# Patient Record
Sex: Female | Born: 1960 | Hispanic: Yes | Marital: Single | Smoking: Never smoker
Health system: Southern US, Community
[De-identification: ages and names within clinical notes are randomized; demographics above are authoritative.]

## PROBLEM LIST (undated history)

## (undated) HISTORY — PX: BREAST CYST EXCISION: SHX579

---

## 2008-11-10 HISTORY — PX: BREAST EXCISIONAL BIOPSY: SUR124

## 2012-04-20 ENCOUNTER — Telehealth: Payer: Self-pay | Admitting: Hematology & Oncology

## 2012-04-20 NOTE — Telephone Encounter (Signed)
Called pt to schedule appointment, no answer or voice mail °

## 2012-04-22 ENCOUNTER — Telehealth: Payer: Self-pay | Admitting: Hematology & Oncology

## 2012-04-22 NOTE — Telephone Encounter (Signed)
Pt aware of 05-10-12 appointment. She wants Spanish interpreter. Left [place] message to schedule interpreter.

## 2012-05-10 ENCOUNTER — Telehealth: Payer: Self-pay | Admitting: Hematology & Oncology

## 2012-05-10 ENCOUNTER — Ambulatory Visit (HOSPITAL_BASED_OUTPATIENT_CLINIC_OR_DEPARTMENT_OTHER): Payer: Self-pay | Admitting: Hematology & Oncology

## 2012-05-10 ENCOUNTER — Ambulatory Visit: Payer: Self-pay

## 2012-05-10 ENCOUNTER — Other Ambulatory Visit: Payer: Self-pay | Admitting: Lab

## 2012-05-10 VITALS — BP 109/72 | HR 68 | Temp 97.2°F | Ht 64.0 in | Wt 120.0 lb

## 2012-05-10 DIAGNOSIS — D72820 Lymphocytosis (symptomatic): Secondary | ICD-10-CM

## 2012-05-10 DIAGNOSIS — D72819 Decreased white blood cell count, unspecified: Secondary | ICD-10-CM

## 2012-05-10 LAB — CHCC SATELLITE - SMEAR

## 2012-05-10 LAB — CBC WITH DIFFERENTIAL (CANCER CENTER ONLY)
BASO%: 0.4 % (ref 0.0–2.0)
LYMPH#: 2.7 10*3/uL (ref 0.9–3.3)
LYMPH%: 59.4 % — ABNORMAL HIGH (ref 14.0–48.0)
MONO#: 0.3 10*3/uL (ref 0.1–0.9)
Platelets: 230 10*3/uL (ref 145–400)
RDW: 13.8 % (ref 11.1–15.7)
WBC: 4.5 10*3/uL (ref 3.9–10.0)

## 2012-05-10 NOTE — Progress Notes (Signed)
CC:   Gardiner Rhyme, MD  DIAGNOSIS:  Asymptomatic lymphocytosis.  HISTORY OF PRESENT ILLNESS:  Ms. Reddy is a very nice 51 year old [place] female.  She comes with an interpreter.  She, however, seemed to understand pretty well what was going on.  She is followed by Dr. Greggory Stallion.  Dr. Greggory Stallion noticed that she had some lymphocytosis on some blood work back in May.  Unfortunately I do not have any previous blood work before that.  She has been tested for HIV, hepatitis and all this was negative.  She had normal electrolytes and liver function tests.  TSH was also normal.  With her CBC, her white cell count was 4.7.  She had hemoglobin of 12.9, hematocrit 39.8.  Platelet count was 328.  She had a white cell differential of 34 segs, 59 lymphs, 5 monos.  Dr. Greggory Stallion was worried about this and as such, referred her to the [facility] for an evaluation.  Ms. Corrow feels well.  She has not had any problem with infections. There has been no weight loss or weight gain.  She has had no fevers or sweats.  There has been no fatigue.  There is no cough or shortness of breath.  There has been no change in bowel or bladder habits.  She has had no change in medications.  Again, we were asked to see her because of the lymphocytosis.  PAST MEDICAL HISTORY:  Remarkable for a left breast lumpectomy, benign condition.  ALLERGIES: 1. Cipro. 2. Naprosyn.  MEDICATIONS: 1. Neurontin 100 mg p.o. t.i.d. 2. Flonase nasal spray 1 spray each nostril 2 times a day.  SOCIAL HISTORY:  Negative for tobacco or alcohol use.  She has no obvious occupational exposures.  FAMILY HISTORY:  Remarkable for multiple members with cancer.  Her father died of lung cancer.  There is history of breast cancer, leukemia, I think throat cancer.  REVIEW OF SYSTEMS:  As stated in the history present illness.  No additional findings are noted on a 12 system review.  PHYSICAL EXAMINATION:  General:  This  is a well-developed, well- nourished Hispanic female in no obvious distress.  Vital signs:  Show a temperature of 97.9, pulse 68, respiratory rate 18, blood pressure 109/72.  Weight is 120.  Head and neck:  Normocephalic, atraumatic skull.  There are no ocular or oral lesions.  Pupils react appropriately.  There is no adenopathy in her neck or supraclavicular regions bilaterally.  Thyroid is nonpalpable.  Lungs:  Clear bilaterally.  Cardiac:  Regular rate and rhythm with a normal S1 and S2. There are no murmurs, rubs or bruits.  Axillary exam shows no bilateral axillary adenopathy.  Abdomen:  Soft with good bowel sounds.  There is no palpable abdominal mass.  There is no fluid wave.  There is no palpable hepatosplenomegaly.  Back:  No tenderness over the spine, ribs or hips.  Extremities:  Show no clubbing, cyanosis or edema.  Skin:  No rashes, ecchymoses or petechiae.  Neurological:  Shows no focal neurological deficits.  LABORATORY STUDIES:  White cell count is 4.5, hemoglobin 13.5, hematocrit 39.8, platelet count 230.  White cell differential shows 34 segs, 59 lymphs, 6 monos.  Peripheral smear shows a normochromic, normocytic population of red blood cells.  There are no nucleated red blood cells.  There are no teardrop cells.  I see no rouleaux formation. There are no schistocytes.  I see no target cells.  White cells appear with an increase in lymphocytes.  Lymphocytes appear mature for the most part.  There may be a couple large lymphocytes.  I do not see any immature myeloid or lymphoid forms.  There are no hypersegmented polys. I do not see any blasts.  Platelets are adequate in number and size.  IMPRESSION:  Ms. Oviatt is a 51 year old Hispanic female with a relative lymphocytosis.  Her white cell count is normal.  In fact, her total lymphocytes are within normal limits.  It is hard to say why she has a lymphocytosis.  From the blood smear, I do not see anything that looks  suspicious.  I do not believe we have to send off flow cytometry for a lymphoproliferative process.  I feel that the chance of her having a lymphoproliferative disorder is quite low.  I do not see anything on physical exam that is suspicious.  She has no lymphadenopathy.  There is no splenomegaly.  I do not see anything with respect to her skin that looks suspicious.  I believe that we can just watch Ms. Fluharty for now.  She does not need a bone marrow test.  I want see her back in 4 months.  Will see what her white cell differential is at that point in time.  If we find that there is a significant increase in lymphocytes, then we may order a flow cytometry study to rule out occult CLL.  Ms. Gendron is understandably concerned because of the family history of cancer.  I tried to reassure her as much as I could that I did not see any evidence of malignancy or any type of leukemia at this point time.  I spent a good hour with Ms. Zion today.  Her interpreter was very, very helpful and I really believe that Ms. Seda understood all that we were talking about.    ______________________________ Josph Macho, M.D. PRE/MEDQ  D:  05/10/2012  T:  05/10/2012  Job:  [id number]

## 2012-05-10 NOTE — Telephone Encounter (Signed)
Amber Orozco at [facility] is aware to schedule interpreter for 10-31 2 hours

## 2012-05-10 NOTE — Progress Notes (Signed)
This office note has been dictated.

## 2012-05-21 ENCOUNTER — Other Ambulatory Visit: Payer: Self-pay

## 2012-09-09 ENCOUNTER — Ambulatory Visit: Payer: Self-pay | Admitting: Hematology & Oncology

## 2012-09-09 ENCOUNTER — Other Ambulatory Visit: Payer: Self-pay | Admitting: Lab

## 2012-09-10 ENCOUNTER — Telehealth: Payer: Self-pay | Admitting: Hematology & Oncology

## 2012-09-10 NOTE — Telephone Encounter (Signed)
Left message through interpreter service for pt to call and reschedule missed 09-09-12 appointment

## 2013-05-08 ENCOUNTER — Emergency Department (HOSPITAL_BASED_OUTPATIENT_CLINIC_OR_DEPARTMENT_OTHER)
Admission: EM | Admit: 2013-05-08 | Discharge: 2013-05-08 | Disposition: A | Discharge status: Home / Self Care | Payer: 59 | Attending: Emergency Medicine | Admitting: Emergency Medicine

## 2013-05-08 ENCOUNTER — Encounter (HOSPITAL_BASED_OUTPATIENT_CLINIC_OR_DEPARTMENT_OTHER): Payer: Self-pay | Admitting: *Deleted

## 2013-05-08 DIAGNOSIS — J329 Chronic sinusitis, unspecified: Secondary | ICD-10-CM | POA: Insufficient documentation

## 2013-05-08 DIAGNOSIS — J3489 Other specified disorders of nose and nasal sinuses: Secondary | ICD-10-CM | POA: Insufficient documentation

## 2013-05-08 MED ORDER — DEXAMETHASONE SODIUM PHOSPHATE 10 MG/ML IJ SOLN
10.0000 mg | Freq: Once | INTRAMUSCULAR | Status: AC
  Administered 2013-05-08: 10 mg via INTRAMUSCULAR
  Filled 2013-05-08: qty 1

## 2013-05-08 MED ORDER — FEXOFENADINE-PSEUDOEPHED ER 60-120 MG PO TB12: 1.0000 | ORAL_TABLET | Freq: Two times a day (BID) | ORAL | Status: DC

## 2013-05-08 NOTE — ED Provider Notes (Signed)
History    This chart was scribed for Rolan Bucco, MD, MD by Ashley Jacobs, ED Scribe. The patient was seen in room MH04/MH04 and the patient's care was started at 10:10 PM  CSN: [id number] Arrival date & time 05/08/13  1926    Chief Complaint  Patient presents with  . Facial Pain    The history is provided by the patient and medical records. No language interpreter was used.   HPI Comments: Amber Orozco is a 52 y.o. female who presents to the Emergency Department complaining of moderate, constant facial pain and pressure in her sinuses over the past three days. Pt reports having headache.  Pt denies fever, chills, nausea, sore throat, vomiting, diarrhea, weakness, cough, SOB and any other pain. Pt denies taking any allergy or sinus medications PTA.   History reviewed. No pertinent past medical history. Past Surgical History  Procedure Laterality Date  . Cesarean section    . Breast cyst excision     No family history on file. History  Substance Use Topics  . Smoking status: Never Smoker   . Smokeless tobacco: Never Used  . Alcohol Use: 0.6 oz/week    1 Cans of beer per week   OB History   Grav Para Term Preterm Abortions TAB SAB Ect Mult Living                 Review of Systems  Constitutional: Negative for fever and chills.  HENT: Positive for sinus pressure.   Respiratory: Negative for shortness of breath.   Gastrointestinal: Negative for nausea and vomiting.  Neurological: Negative for weakness.  All other systems reviewed and are negative.    Allergies  Ciprofloxacin and Naproxen  Home Medications   Current Outpatient Rx  Name  Route  Sig  Dispense  Refill  . fexofenadine-pseudoephedrine (ALLEGRA-D) 60-120 MG per tablet   Oral   Take 1 tablet by mouth every 12 (twelve) hours.   30 tablet   0    BP 99/53  Pulse 66  Temp(Src) 98.1 F (36.7 C) (Oral)  Resp 16  SpO2 100% Physical Exam  Nursing note and vitals reviewed. Constitutional: She is  oriented to person, place, and time. She appears well-developed and well-nourished.  HENT:  Head: Normocephalic and atraumatic.  Right Ear: External ear normal.  Left Ear: External ear normal.  Mouth/Throat: Oropharynx is clear and moist.  Mild tender bilateral over maxillary sinuses  Eyes: Pupils are equal, round, and reactive to light.     Neck: Normal range of motion. Neck supple.  Cardiovascular: Normal rate, regular rhythm and normal heart sounds.   Pulmonary/Chest: Effort normal and breath sounds normal. No respiratory distress. She has no wheezes. She has no rales. She exhibits no tenderness.  Abdominal: Soft. Bowel sounds are normal. There is no tenderness. There is no rebound and no guarding.  Musculoskeletal: Normal range of motion. She exhibits no edema.  Lymphadenopathy:    She has no cervical adenopathy.  Neurological: She is alert and oriented to person, place, and time.  Skin: Skin is warm and dry. No rash noted.  Psychiatric: She has a normal mood and affect.    ED Course  Procedures (including critical care time) DIAGNOSTIC STUDIES: Oxygen Saturation is 100% on room air, normal by my interpretation.    COORDINATION OF CARE: 10:22 PM Discussed ED treatment with pt and pt agrees.    Labs Reviewed - No data to display No results found. 1. Sinusitis     MDM  Patient is given a shot of Decadron. She was given prescription for Allegra-D. She was given outpatient resource guide for followup. She is advised to return as needed for worsening symptoms.  I personally performed the services described in this documentation, which was scribed in my presence.  The recorded information has been reviewed and considered.    Rolan Bucco, MD 05/08/13 [id number]

## 2013-05-08 NOTE — ED Notes (Signed)
Facial pain and sinus pressure x 3 days

## 2015-01-31 ENCOUNTER — Other Ambulatory Visit: Payer: Self-pay

## 2015-01-31 ENCOUNTER — Ambulatory Visit
Admission: RE | Admit: 2015-01-31 | Discharge: 2015-01-31 | Disposition: A | Discharge status: Home / Self Care | Payer: Managed Care, Other (non HMO) | Source: Ambulatory Visit

## 2015-01-31 DIAGNOSIS — Z803 Family history of malignant neoplasm of breast: Secondary | ICD-10-CM

## 2015-01-31 DIAGNOSIS — Z1231 Encounter for screening mammogram for malignant neoplasm of breast: Secondary | ICD-10-CM

## 2016-04-08 ENCOUNTER — Other Ambulatory Visit: Payer: Self-pay

## 2016-04-08 DIAGNOSIS — Z1231 Encounter for screening mammogram for malignant neoplasm of breast: Secondary | ICD-10-CM

## 2016-04-28 ENCOUNTER — Ambulatory Visit
Admission: RE | Admit: 2016-04-28 | Discharge: 2016-04-28 | Disposition: A | Discharge status: Home / Self Care | Payer: Managed Care, Other (non HMO) | Source: Ambulatory Visit

## 2016-04-28 DIAGNOSIS — Z1231 Encounter for screening mammogram for malignant neoplasm of breast: Secondary | ICD-10-CM

## 2018-06-14 ENCOUNTER — Other Ambulatory Visit: Payer: Self-pay | Admitting: Family Medicine

## 2018-06-14 DIAGNOSIS — Z1231 Encounter for screening mammogram for malignant neoplasm of breast: Secondary | ICD-10-CM

## 2018-07-09 ENCOUNTER — Ambulatory Visit
Admission: RE | Admit: 2018-07-09 | Discharge: 2018-07-09 | Disposition: A | Discharge status: Home / Self Care | Payer: Managed Care, Other (non HMO) | Source: Ambulatory Visit | Attending: Family Medicine | Admitting: Family Medicine

## 2018-07-09 DIAGNOSIS — Z1231 Encounter for screening mammogram for malignant neoplasm of breast: Secondary | ICD-10-CM

## 2018-10-29 ENCOUNTER — Other Ambulatory Visit: Payer: Self-pay | Admitting: Physician Assistant

## 2018-10-29 DIAGNOSIS — R2232 Localized swelling, mass and lump, left upper limb: Secondary | ICD-10-CM

## 2018-10-29 DIAGNOSIS — N6320 Unspecified lump in the left breast, unspecified quadrant: Secondary | ICD-10-CM

## 2018-11-05 ENCOUNTER — Ambulatory Visit
Admission: RE | Admit: 2018-11-05 | Discharge: 2018-11-05 | Disposition: A | Discharge status: Home / Self Care | Payer: Managed Care, Other (non HMO) | Source: Ambulatory Visit | Attending: Physician Assistant | Admitting: Physician Assistant

## 2018-11-05 ENCOUNTER — Other Ambulatory Visit: Payer: Self-pay | Admitting: Physician Assistant

## 2018-11-05 DIAGNOSIS — R2232 Localized swelling, mass and lump, left upper limb: Secondary | ICD-10-CM

## 2018-11-05 DIAGNOSIS — N6320 Unspecified lump in the left breast, unspecified quadrant: Secondary | ICD-10-CM

## 2020-06-19 ENCOUNTER — Other Ambulatory Visit: Payer: Self-pay | Admitting: Family Medicine

## 2020-06-19 ENCOUNTER — Other Ambulatory Visit: Payer: Self-pay | Admitting: Physician Assistant

## 2020-06-19 DIAGNOSIS — Z1231 Encounter for screening mammogram for malignant neoplasm of breast: Secondary | ICD-10-CM

## 2020-06-29 ENCOUNTER — Other Ambulatory Visit: Payer: Self-pay

## 2020-06-29 ENCOUNTER — Ambulatory Visit
Admission: RE | Admit: 2020-06-29 | Discharge: 2020-06-29 | Disposition: A | Discharge status: Home / Self Care | Payer: 59 | Source: Ambulatory Visit | Attending: Physician Assistant | Admitting: Physician Assistant

## 2020-06-29 DIAGNOSIS — Z1231 Encounter for screening mammogram for malignant neoplasm of breast: Secondary | ICD-10-CM

## 2020-07-03 ENCOUNTER — Other Ambulatory Visit: Payer: Self-pay | Admitting: Physician Assistant

## 2020-07-03 DIAGNOSIS — R928 Other abnormal and inconclusive findings on diagnostic imaging of breast: Secondary | ICD-10-CM

## 2020-07-23 ENCOUNTER — Other Ambulatory Visit: Payer: Self-pay | Admitting: Physician Assistant

## 2020-07-23 ENCOUNTER — Ambulatory Visit
Admission: RE | Admit: 2020-07-23 | Discharge: 2020-07-23 | Disposition: A | Discharge status: Home / Self Care | Payer: BC Managed Care – PPO | Source: Ambulatory Visit | Attending: Physician Assistant | Admitting: Physician Assistant

## 2020-07-23 ENCOUNTER — Other Ambulatory Visit: Payer: Self-pay

## 2020-07-23 DIAGNOSIS — R928 Other abnormal and inconclusive findings on diagnostic imaging of breast: Secondary | ICD-10-CM

## 2020-07-27 ENCOUNTER — Ambulatory Visit
Admission: RE | Admit: 2020-07-27 | Discharge: 2020-07-27 | Disposition: A | Discharge status: Home / Self Care | Payer: BC Managed Care – PPO | Source: Ambulatory Visit | Attending: Physician Assistant | Admitting: Physician Assistant

## 2020-07-27 ENCOUNTER — Other Ambulatory Visit: Payer: Self-pay

## 2020-07-27 DIAGNOSIS — R928 Other abnormal and inconclusive findings on diagnostic imaging of breast: Secondary | ICD-10-CM

## 2020-08-17 ENCOUNTER — Ambulatory Visit: Payer: Self-pay | Admitting: Surgery

## 2020-08-17 DIAGNOSIS — N6489 Other specified disorders of breast: Secondary | ICD-10-CM

## 2020-08-17 NOTE — H&P (Signed)
History of Present Illness Amber Orozco. Amber Eiben MD; 08/17/2020 10:35 AM) The patient is a 59 year old female who presents with a breast mass. Referred by Amber Axe, MD for left breast CSL  This is this is a healthy 59 year old female who presents with a recent screening mammogram that showed asymmetry in the left breast. Further workup showed a 6 x 4 mm complex sclerosing lesion confirmed by biopsy. No family history of breast cancer in first-degree relatives. Her mother and sister both had lung cancer. A second cousin passed away of breast cancer.  The patient had a previous benign breast biopsy in the lower medial left breast. This was performed [place] 2010.  CLINICAL DATA: Screening.  EXAM: DIGITAL SCREENING BILATERAL MAMMOGRAM WITH TOMO AND CAD  COMPARISON: Previous exam(s).  ACR Breast Density Category c: The breast tissue is heterogeneously dense, which may obscure small masses.  FINDINGS: In the left breast, possible distortion warrants further evaluation. In the right breast, no findings suspicious for malignancy. Images were processed with CAD.  IMPRESSION: Further evaluation is suggested for possible distortion in the left breast.  RECOMMENDATION: Diagnostic mammogram and possibly ultrasound of the left breast. (Code:FI-L-46M)  The patient will be contacted regarding the findings, and additional imaging will be scheduled.  BI-RADS CATEGORY 0: Incomplete. Need additional imaging evaluation and/or prior mammograms for comparison.   Electronically Signed By: Amber Orozco M.D. On: 07/02/2020 13:02  CLINICAL DATA: Recall from screening mammography with tomosynthesis, possible architectural distortion involving the OUTER LEFT breast at MIDDLE depth. Personal history of benign excisional biopsy from the LEFT breast in 2010, though the surgical scar is in the INNER breast.  Family history of breast cancer in her sister.  EXAM: DIGITAL DIAGNOSTIC LEFT  MAMMOGRAM WITH TOMO  ULTRASOUND LEFT BREAST  COMPARISON: Previous exam(s).  ACR Breast Density Category c: The breast tissue is heterogeneously dense, which may obscure small masses.  FINDINGS: Tomosynthesis and synthesized spot-compression CC and MLO views of the area of concern in the LEFT breast were obtained.  The small focus of architectural distortion persists on the spot compression images and is more conspicuous on the MLO images. There is no associated mass or suspicious calcifications.  Targeted LEFT breast ultrasound is performed, showing an irregular antiparallel hypoechoic mass at the 3 o'clock position approximately 4 cm from the nipple at MIDDLE to POSTERIOR depth measuring approximately 6 x 4 x 4 mm, demonstrating posterior acoustic shadowing and demonstrating internal power Doppler flow. This is likely the sonographic correlate for the mammographic distortion. Immediately adjacent to this mass is a benign cyst.  Sonographic evaluation of the LEFT axilla demonstrates no pathologic lymphadenopathy.  IMPRESSION: 1. Suspicious 6 mm mass in the OUTER LEFT breast at 3 o'clock position approximately 4 cm from the nipple which is the likely sonographic correlate for the mammographically detected architectural distortion. 2. No pathologic LEFT axillary lymphadenopathy.  RECOMMENDATION: Ultrasound-guided core needle biopsy of the suspicious LEFT breast mass.  If the post clip mammograms after the ultrasound biopsy demonstrate that the suspicious sonographic mass is not the correlate for the mammographic distortion, then stereotactic tomosynthesis biopsy of the distortion would be recommended.  The ultrasound biopsy procedure was discussed with the patient and her questions were answered. She wishes to proceed with the biopsy which has been scheduled at her convenience.  I have discussed the findings and recommendations with the patient.  BI-RADS CATEGORY 5:  Highly suggestive of malignancy.   Electronically Signed By: Amber Orozco M.D. On: 07/23/2020 16:44  CLINICAL DATA: 59 year old female presenting for ultrasound-guided biopsy of a left breast mass.  EXAM: ULTRASOUND GUIDED LEFT BREAST CORE NEEDLE BIOPSY  COMPARISON: Previous exam(s).  PROCEDURE: I met with the patient and we discussed the procedure of ultrasound-guided biopsy, including benefits and alternatives. We discussed the high likelihood of a successful procedure. We discussed the risks of the procedure, including infection, bleeding, tissue injury, clip migration, and inadequate sampling. Informed written consent was given. The usual time-out protocol was performed immediately prior to the procedure.  Lesion quadrant: Lower outer quadrant  Using sterile technique and 1% Lidocaine as local anesthetic, under direct ultrasound visualization, a 14 gauge spring-loaded device was used to perform biopsy of a mass in the left breast at 3 o'clock using a lateral approach. At the conclusion of the procedure a ribbon shaped tissue marker clip was deployed into the biopsy cavity. Follow up 2 view mammogram was performed and dictated separately.  IMPRESSION: Ultrasound guided biopsy of a left breast mass at 3 o'clock. No apparent complications.  Electronically Signed: By: Amber Orozco M.D. 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 This was found to be concordant by Dr. Ammie Orozco, with excision recommended.  Pathology results were discussed with the patient by telephone with Amber Orozco Bilingual Patient Services Representative. The patient reported doing well after the biopsy with tenderness at the site. Post biopsy instructions and care were reviewed and questions were answered. The patient was encouraged to call [facility] for any additional concerns.  Amber Orozco CMA with Amber [facility]co [facility] was notified of results and patient request. Surgical consultation has been arranged with Dr. Donnie Mesa at [facility] on August 19, 2020, per patient request.  Pathology results reported by Amber Acres RN on 07/30/2020.   Electronically Signed By: Amber Orozco M.D. On: 07/30/2020 14:51    Problem List/Past Medical Amber Key K. Natisha Trzcinski, MD; 08/17/2020 10:35 AM) MASS OF LEFT BREAST ON MAMMOGRAM (N63.20)  Past Surgical History (Amber Orozco, CMA; 08/17/2020 9:15 AM) Breast Mass; Local Excision Left. Cesarean Section - 1  Diagnostic Studies History (Amber Orozco, CMA; 08/17/2020 9:15 AM) Colonoscopy 1-5 years ago Mammogram within last year Pap Smear 1-5 years ago  Allergies (Amber Orozco, CMA; 08/17/2020 9:16 AM) Cipro *FLUOROQUINOLONES* Allergies Reconciled  Medication History (Amber Orozco, CMA; 08/17/2020 9:16 AM) No Current Medications Medications Reconciled  Pregnancy / Birth History Amber Orozco, CMA; 08/17/2020 9:15 AM) Age of menopause 67-50 Gravida 3 Irregular periods Maternal age 18-40  Other Problems Amber Orozco. Roslyn Else, MD; 08/17/2020 10:35 AM) No pertinent past medical history     Review of Systems (Amber Orozco; 08/17/2020 9:15 AM) General Not Present- Appetite Loss, Chills, Fatigue, Fever, Night Sweats, Weight Gain and Weight Loss. Skin Not Present- Change in Wart/Mole, Dryness, Hives, Jaundice, New Lesions, Non-Healing Wounds, Rash and Ulcer. HEENT Not Present- Earache, Hearing Loss, Hoarseness, Nose Bleed, Oral Ulcers, Ringing in the Ears, Seasonal Allergies, Sinus Pain, Sore Throat, Visual Disturbances, Wears glasses/contact lenses and Yellow Eyes. Respiratory Not Present- Bloody sputum, Chronic Cough, Difficulty Breathing, Snoring and Wheezing. Breast Not Present- Breast  Mass, Breast Pain, Nipple Discharge and Skin Changes. Cardiovascular Not Present- Chest Pain, Difficulty Breathing Lying Down, Leg Cramps, Palpitations, Rapid Heart Rate, Shortness of Breath and Swelling of Extremities. Gastrointestinal Not Present- Abdominal Pain, Bloating, Bloody Stool, Change in Bowel Habits, Chronic diarrhea, Constipation, Difficulty Swallowing,  Excessive gas, Gets full quickly at Orozco, Hemorrhoids, Indigestion, Nausea, Rectal Pain and Vomiting. Female Genitourinary Not Present- Frequency, Nocturia, Painful Urination, Pelvic Pain and Urgency. Musculoskeletal Not Present- Back Pain, Joint Pain, Joint Stiffness, Muscle Pain, Muscle Weakness and Swelling of Extremities. Neurological Not Present- Decreased Memory, Fainting, Headaches, Numbness, Seizures, Tingling, Tremor, Trouble walking and Weakness. Psychiatric Not Present- Anxiety, Bipolar, Change in Sleep Pattern, Depression, Fearful and Frequent crying. Endocrine Not Present- Cold Intolerance, Excessive Hunger, Hair Changes, Heat Intolerance, Hot flashes and New Diabetes. Hematology Not Present- Blood Thinners, Easy Bruising, Excessive bleeding, Gland problems, HIV and Persistent Infections.  Vitals (Amber Nolan CMA; 08/17/2020 9:16 AM) 08/17/2020 9:16 AM Weight: 129 lb Height: 62in Body Surface Area: 1.59 m Body Mass Index: 23.59 kg/m  Temp.: 97.36F  Pulse: 72 (Regular)  BP: 120/72(Sitting, Left Arm, Standard)        Physical Exam Amber Key K. Einer Meals MD; 08/17/2020 10:36 AM)  The physical exam findings are as follows: Note:Constitutional: WDWN in NAD, conversant, no obvious deformities; resting comfortably Eyes: Pupils equal, round; sclera anicteric; moist conjunctiva; no lid lag HENT: Oral mucosa moist; good dentition Neck: No masses palpated, trachea midline; no thyromegaly Lungs: CTA bilaterally; normal respiratory effort Breasts: Symmetrical, no nipple changes or discharge, no right breast masses  or axillary lymphadenopathy Left breast shows a healed lower inner quadrant transverse incision. No palpable masses. No axillary lymphadenopathy. CV: Regular rate and rhythm; no murmurs; extremities well-perfused with no edema Abd: +bowel sounds, soft, non-tender, no palpable organomegaly; no palpable hernias Musc: Normal gait; no apparent clubbing or cyanosis in extremities Lymphatic: No palpable cervical or axillary lymphadenopathy Skin: Warm, dry; no sign of jaundice Psychiatric - alert and oriented x 4; calm mood and affect    Assessment & Plan Amber Key K. 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 The surgical procedure has been discussed with the patient. Potential risks, benefits, alternative treatments, and expected outcomes have been explained. All of the patient's questions at this time have been answered. The likelihood of reaching the patient's treatment goal is good. The patient understand the proposed surgical procedure and wishes to proceed.  Amber Orozco. Georgette Dover, MD, [facility]  General/ Trauma Surgery   08/17/2020 10:36 AM

## 2020-08-22 ENCOUNTER — Other Ambulatory Visit: Payer: Self-pay | Admitting: Surgery

## 2020-08-22 DIAGNOSIS — N6489 Other specified disorders of breast: Secondary | ICD-10-CM

## 2020-09-20 ENCOUNTER — Encounter (HOSPITAL_BASED_OUTPATIENT_CLINIC_OR_DEPARTMENT_OTHER): Payer: Self-pay | Admitting: Surgery

## 2020-09-20 ENCOUNTER — Other Ambulatory Visit: Payer: Self-pay

## 2020-09-21 ENCOUNTER — Other Ambulatory Visit (HOSPITAL_COMMUNITY): Payer: Self-pay | Admitting: *Deleted

## 2020-09-21 ENCOUNTER — Other Ambulatory Visit (HOSPITAL_COMMUNITY)
Admission: RE | Admit: 2020-09-21 | Discharge: 2020-09-21 | Disposition: A | Discharge status: Home / Self Care | Payer: BC Managed Care – PPO | Source: Ambulatory Visit | Attending: Surgery | Admitting: Surgery

## 2020-09-21 DIAGNOSIS — Z01812 Encounter for preprocedural laboratory examination: Secondary | ICD-10-CM | POA: Insufficient documentation

## 2020-09-21 DIAGNOSIS — Z20822 Contact with and (suspected) exposure to covid-19: Secondary | ICD-10-CM | POA: Diagnosis not present

## 2020-09-21 LAB — SARS CORONAVIRUS 2 (TAT 6-24 HRS): SARS Coronavirus 2: NEGATIVE

## 2020-09-21 NOTE — Progress Notes (Signed)

## 2020-09-24 ENCOUNTER — Other Ambulatory Visit: Payer: Self-pay

## 2020-09-24 ENCOUNTER — Ambulatory Visit
Admission: RE | Admit: 2020-09-24 | Discharge: 2020-09-24 | Disposition: A | Discharge status: Home / Self Care | Payer: BC Managed Care – PPO | Source: Ambulatory Visit | Attending: Surgery | Admitting: Surgery

## 2020-09-24 DIAGNOSIS — N6489 Other specified disorders of breast: Secondary | ICD-10-CM

## 2020-09-25 ENCOUNTER — Ambulatory Visit (HOSPITAL_BASED_OUTPATIENT_CLINIC_OR_DEPARTMENT_OTHER)
Admission: RE | Admit: 2020-09-25 | Discharge: 2020-09-25 | Disposition: A | Discharge status: Home / Self Care | Payer: BC Managed Care – PPO | Attending: Surgery | Admitting: Surgery

## 2020-09-25 ENCOUNTER — Ambulatory Visit (HOSPITAL_BASED_OUTPATIENT_CLINIC_OR_DEPARTMENT_OTHER): Payer: BC Managed Care – PPO | Admitting: Certified Registered"

## 2020-09-25 ENCOUNTER — Ambulatory Visit
Admission: RE | Admit: 2020-09-25 | Discharge: 2020-09-25 | Disposition: A | Discharge status: Home / Self Care | Payer: BC Managed Care – PPO | Source: Ambulatory Visit | Attending: Surgery | Admitting: Surgery

## 2020-09-25 ENCOUNTER — Other Ambulatory Visit: Payer: Self-pay

## 2020-09-25 ENCOUNTER — Encounter (HOSPITAL_BASED_OUTPATIENT_CLINIC_OR_DEPARTMENT_OTHER): Payer: Self-pay | Admitting: Surgery

## 2020-09-25 ENCOUNTER — Encounter (HOSPITAL_BASED_OUTPATIENT_CLINIC_OR_DEPARTMENT_OTHER)
Admission: RE | Disposition: A | Discharge status: Home / Self Care | Payer: Self-pay | Source: Home / Self Care | Attending: Surgery

## 2020-09-25 DIAGNOSIS — N6489 Other specified disorders of breast: Secondary | ICD-10-CM | POA: Insufficient documentation

## 2020-09-25 DIAGNOSIS — Z803 Family history of malignant neoplasm of breast: Secondary | ICD-10-CM | POA: Diagnosis not present

## 2020-09-25 DIAGNOSIS — N6092 Unspecified benign mammary dysplasia of left breast: Secondary | ICD-10-CM | POA: Diagnosis not present

## 2020-09-25 DIAGNOSIS — Z881 Allergy status to other antibiotic agents status: Secondary | ICD-10-CM | POA: Diagnosis not present

## 2020-09-25 HISTORY — PX: BREAST LUMPECTOMY WITH RADIOACTIVE SEED LOCALIZATION: SHX6424

## 2020-09-25 SURGERY — BREAST LUMPECTOMY WITH RADIOACTIVE SEED LOCALIZATION
Anesthesia: General | Site: Breast | Laterality: Left

## 2020-09-25 MED ORDER — ONDANSETRON HCL 4 MG/2ML IJ SOLN
INTRAMUSCULAR | Status: AC
  Filled 2020-09-25: qty 2

## 2020-09-25 MED ORDER — MIDAZOLAM HCL 2 MG/2ML IJ SOLN
INTRAMUSCULAR | Status: AC
  Filled 2020-09-25: qty 2

## 2020-09-25 MED ORDER — DEXAMETHASONE SODIUM PHOSPHATE 10 MG/ML IJ SOLN
INTRAMUSCULAR | Status: DC | PRN
  Administered 2020-09-25: 5 mg via INTRAVENOUS

## 2020-09-25 MED ORDER — CHLORHEXIDINE GLUCONATE CLOTH 2 % EX PADS: 6.0000 | MEDICATED_PAD | Freq: Once | CUTANEOUS | Status: DC

## 2020-09-25 MED ORDER — MEPERIDINE HCL 25 MG/ML IJ SOLN: 6.2500 mg | INTRAMUSCULAR | Status: DC | PRN

## 2020-09-25 MED ORDER — LACTATED RINGERS IV SOLN: INTRAVENOUS | Status: DC

## 2020-09-25 MED ORDER — OXYCODONE HCL 5 MG/5ML PO SOLN: 5.0000 mg | Freq: Once | ORAL | Status: DC | PRN

## 2020-09-25 MED ORDER — CEFAZOLIN SODIUM-DEXTROSE 2-4 GM/100ML-% IV SOLN
2.0000 g | INTRAVENOUS | Status: AC
  Administered 2020-09-25: 2 g via INTRAVENOUS

## 2020-09-25 MED ORDER — PROPOFOL 10 MG/ML IV BOLUS
INTRAVENOUS | Status: AC
  Filled 2020-09-25: qty 20

## 2020-09-25 MED ORDER — OXYCODONE HCL 5 MG PO TABS: 5.0000 mg | ORAL_TABLET | Freq: Once | ORAL | Status: DC | PRN

## 2020-09-25 MED ORDER — ACETAMINOPHEN 500 MG PO TABS
1000.0000 mg | ORAL_TABLET | ORAL | Status: AC
  Administered 2020-09-25: 1000 mg via ORAL

## 2020-09-25 MED ORDER — BUPIVACAINE HCL 0.25 % IJ SOLN
INTRAMUSCULAR | Status: DC | PRN
  Administered 2020-09-25: 10 mL

## 2020-09-25 MED ORDER — CEFAZOLIN SODIUM-DEXTROSE 2-4 GM/100ML-% IV SOLN
INTRAVENOUS | Status: AC
  Filled 2020-09-25: qty 100

## 2020-09-25 MED ORDER — DEXAMETHASONE SODIUM PHOSPHATE 10 MG/ML IJ SOLN
INTRAMUSCULAR | Status: AC
  Filled 2020-09-25: qty 1

## 2020-09-25 MED ORDER — MIDAZOLAM HCL 5 MG/5ML IJ SOLN
INTRAMUSCULAR | Status: DC | PRN
  Administered 2020-09-25: 2 mg via INTRAVENOUS

## 2020-09-25 MED ORDER — PROPOFOL 10 MG/ML IV BOLUS
INTRAVENOUS | Status: DC | PRN
  Administered 2020-09-25: 110 mg via INTRAVENOUS

## 2020-09-25 MED ORDER — ACETAMINOPHEN 160 MG/5ML PO SOLN: 325.0000 mg | ORAL | Status: DC | PRN

## 2020-09-25 MED ORDER — KETOROLAC TROMETHAMINE 15 MG/ML IJ SOLN: 15.0000 mg | Freq: Once | INTRAMUSCULAR | Status: DC

## 2020-09-25 MED ORDER — EPHEDRINE 5 MG/ML INJ
INTRAVENOUS | Status: AC
  Filled 2020-09-25: qty 10

## 2020-09-25 MED ORDER — ONDANSETRON HCL 4 MG/2ML IJ SOLN: 4.0000 mg | Freq: Once | INTRAMUSCULAR | Status: DC | PRN

## 2020-09-25 MED ORDER — FENTANYL CITRATE (PF) 100 MCG/2ML IJ SOLN
INTRAMUSCULAR | Status: DC | PRN
  Administered 2020-09-25: 25 ug via INTRAVENOUS

## 2020-09-25 MED ORDER — LIDOCAINE HCL (CARDIAC) PF 100 MG/5ML IV SOSY
PREFILLED_SYRINGE | INTRAVENOUS | Status: DC | PRN
  Administered 2020-09-25: 100 mg via INTRAVENOUS

## 2020-09-25 MED ORDER — GABAPENTIN 300 MG PO CAPS
300.0000 mg | ORAL_CAPSULE | ORAL | Status: AC
  Administered 2020-09-25: 300 mg via ORAL

## 2020-09-25 MED ORDER — FENTANYL CITRATE (PF) 100 MCG/2ML IJ SOLN
INTRAMUSCULAR | Status: AC
  Filled 2020-09-25: qty 2

## 2020-09-25 MED ORDER — GABAPENTIN 300 MG PO CAPS
ORAL_CAPSULE | ORAL | Status: AC
  Filled 2020-09-25: qty 1

## 2020-09-25 MED ORDER — ACETAMINOPHEN 10 MG/ML IV SOLN: 1000.0000 mg | Freq: Once | INTRAVENOUS | Status: DC | PRN

## 2020-09-25 MED ORDER — ACETAMINOPHEN 500 MG PO TABS
ORAL_TABLET | ORAL | Status: AC
  Filled 2020-09-25: qty 2

## 2020-09-25 MED ORDER — EPHEDRINE SULFATE 50 MG/ML IJ SOLN
INTRAMUSCULAR | Status: DC | PRN
  Administered 2020-09-25 (×2): 15 mg via INTRAVENOUS

## 2020-09-25 MED ORDER — ONDANSETRON HCL 4 MG/2ML IJ SOLN
INTRAMUSCULAR | Status: DC | PRN
  Administered 2020-09-25: 4 mg via INTRAVENOUS

## 2020-09-25 MED ORDER — FENTANYL CITRATE (PF) 100 MCG/2ML IJ SOLN: 25.0000 ug | INTRAMUSCULAR | Status: DC | PRN

## 2020-09-25 MED ORDER — LIDOCAINE 2% (20 MG/ML) 5 ML SYRINGE
INTRAMUSCULAR | Status: AC
  Filled 2020-09-25: qty 5

## 2020-09-25 MED ORDER — ACETAMINOPHEN 325 MG PO TABS: 325.0000 mg | ORAL_TABLET | ORAL | Status: DC | PRN

## 2020-09-25 SURGICAL SUPPLY — 41 items
APL PRP STRL LF DISP 70% ISPRP (MISCELLANEOUS) ×1
APL SKNCLS STERI-STRIP NONHPOA (GAUZE/BANDAGES/DRESSINGS) ×1
APPLIER CLIP 9.375 MED OPEN (MISCELLANEOUS) ×3
APR CLP MED 9.3 20 MLT OPN (MISCELLANEOUS) ×1
BENZOIN TINCTURE PRP APPL 2/3 (GAUZE/BANDAGES/DRESSINGS) ×3 IMPLANT
BLADE HEX COATED 2.75 (ELECTRODE) ×3 IMPLANT
BLADE SURG 15 STRL LF DISP TIS (BLADE) ×1 IMPLANT
BLADE SURG 15 STRL SS (BLADE) ×3
CHLORAPREP W/TINT 26 (MISCELLANEOUS) ×3 IMPLANT
CLIP APPLIE 9.375 MED OPEN (MISCELLANEOUS) ×1 IMPLANT
CLOSURE WOUND 1/2 X4 (GAUZE/BANDAGES/DRESSINGS) ×1
COVER BACK TABLE 60X90IN (DRAPES) ×3 IMPLANT
COVER MAYO STAND STRL (DRAPES) ×3 IMPLANT
COVER PROBE W GEL 5X96 (DRAPES) ×3 IMPLANT
DRAPE LAPAROTOMY 100X72 PEDS (DRAPES) ×3 IMPLANT
DRAPE UTILITY XL STRL (DRAPES) ×3 IMPLANT
DRSG TEGADERM 4X4.75 (GAUZE/BANDAGES/DRESSINGS) ×3 IMPLANT
ELECT REM PT RETURN 9FT ADLT (ELECTROSURGICAL) ×3
ELECTRODE REM PT RTRN 9FT ADLT (ELECTROSURGICAL) ×1 IMPLANT
GAUZE SPONGE 4X4 12PLY STRL LF (GAUZE/BANDAGES/DRESSINGS) ×3 IMPLANT
GLOVE BIO SURGEON STRL SZ 6.5 (GLOVE) ×2 IMPLANT
GLOVE BIO SURGEON STRL SZ7 (GLOVE) ×3 IMPLANT
GLOVE BIO SURGEONS STRL SZ 6.5 (GLOVE) ×1
GLOVE BIOGEL PI IND STRL 7.5 (GLOVE) ×1 IMPLANT
GLOVE BIOGEL PI INDICATOR 7.5 (GLOVE) ×2
GOWN STRL REUS W/ TWL LRG LVL3 (GOWN DISPOSABLE) ×2 IMPLANT
GOWN STRL REUS W/TWL LRG LVL3 (GOWN DISPOSABLE) ×6
KIT MARKER MARGIN INK (KITS) ×3 IMPLANT
NEEDLE HYPO 25X1 1.5 SAFETY (NEEDLE) ×3 IMPLANT
NS IRRIG 1000ML POUR BTL (IV SOLUTION) ×3 IMPLANT
PACK BASIN DAY SURGERY FS (CUSTOM PROCEDURE TRAY) ×3 IMPLANT
PENCIL SMOKE EVACUATOR (MISCELLANEOUS) ×3 IMPLANT
SLEEVE SCD COMPRESS KNEE MED (MISCELLANEOUS) ×3 IMPLANT
SPONGE LAP 4X18 RFD (DISPOSABLE) ×3 IMPLANT
STRIP CLOSURE SKIN 1/2X4 (GAUZE/BANDAGES/DRESSINGS) ×2 IMPLANT
SUT MON AB 4-0 PC3 18 (SUTURE) ×3 IMPLANT
SUT VIC AB 3-0 SH 27 (SUTURE) ×3
SUT VIC AB 3-0 SH 27X BRD (SUTURE) ×1 IMPLANT
SYR CONTROL 10ML LL (SYRINGE) ×3 IMPLANT
TOWEL GREEN STERILE FF (TOWEL DISPOSABLE) ×3 IMPLANT
TRAY FAXITRON CT DISP (TRAY / TRAY PROCEDURE) ×3 IMPLANT

## 2020-09-25 NOTE — Discharge Instructions (Signed)
Next dose of Tylenol not until 2:15 today if needed.   [facility] [facility] Office Phone Number [phone]  BREAST BIOPSY/ PARTIAL MASTECTOMY: POST OP INSTRUCTIONS  Always review your discharge instruction sheet given to you by the facility where your surgery was performed.  IF YOU HAVE DISABILITY OR FAMILY LEAVE FORMS, YOU MUST BRING THEM TO THE OFFICE FOR PROCESSING.  DO NOT GIVE THEM TO YOUR DOCTOR.  1. A prescription for pain medication may be given to you upon discharge.  Take your pain medication as prescribed, if needed.  If narcotic pain medicine is not needed, then you may take acetaminophen (Tylenol) or ibuprofen (Advil) as needed. 2. Take your usually prescribed medications unless otherwise directed 3. If you need a refill on your pain medication, please contact your pharmacy.  They will contact our office to request authorization.  Prescriptions will not be filled after 5pm or on week-ends. 4. You should eat very light the first 24 hours after surgery, such as soup, crackers, pudding, etc.  Resume your normal diet the day after surgery. 5. Most patients will experience some swelling and bruising in the breast.  Ice packs and a good support bra will help.  Swelling and bruising can take several days to resolve.  6. It is common to experience some constipation if taking pain medication after surgery.  Increasing fluid intake and taking a stool softener will usually help or prevent this problem from occurring.  A mild laxative (Milk of Magnesia or Miralax) should be taken according to package directions if there are no bowel movements after 48 hours. 7. Unless discharge instructions indicate otherwise, you may remove your bandages 24-48 hours after surgery, and you may shower at that time.  You may have steri-strips (small skin tapes) in place directly over the incision.  These strips should be left on the skin for 7-10 days.  If your surgeon used skin glue on the incision, you may  shower in 24 hours.  The glue will flake off over the next 2-3 weeks.  Any sutures or staples will be removed at the office during your follow-up visit. 8. ACTIVITIES:  You may resume regular daily activities (gradually increasing) beginning the next day.  Wearing a good support bra or sports bra minimizes pain and swelling.  You may have sexual intercourse when it is comfortable. a. You may drive when you no longer are taking prescription pain medication, you can comfortably wear a seatbelt, and you can safely maneuver your car and apply brakes. b. RETURN TO WORK:  ______________________________________________________________________________________ 9. You should see your doctor in the office for a follow-up appointment approximately two weeks after your surgery.  Your doctor's nurse will typically make your follow-up appointment when she calls you with your pathology report.  Expect your pathology report 2-3 business days after your surgery.  You may call to check if you do not hear from [place] after three days. 10. OTHER INSTRUCTIONS: _______________________________________________________________________________________________ _____________________________________________________________________________________________________________________________________ _____________________________________________________________________________________________________________________________________ _____________________________________________________________________________________________________________________________________  WHEN TO CALL YOUR DOCTOR: 1. Fever over 101.0 2. Nausea and/or vomiting. 3. Extreme swelling or bruising. 4. Continued bleeding from incision. 5. Increased pain, redness, or drainage from the incision.  The clinic staff is available to answer your questions during regular business hours.  Please don't hesitate to call and ask to speak to one of the nurses for clinical concerns.   If you have a medical emergency, go to the nearest emergency room or call 911.  A surgeon from [facility] is always on  call at the hospital.  For further questions, please visit [email or web address]    Post Anesthesia Home Care Instructions  Activity: Get plenty of rest for the remainder of the day. A responsible individual must stay with you for 24 hours following the procedure.  For the next 24 hours, DO NOT: -Drive a car -Paediatric nurse -Drink alcoholic beverages -Take any medication unless instructed by your physician -Make any legal decisions or sign important papers.  Meals: Start with liquid foods such as gelatin or soup. Progress to regular foods as tolerated. Avoid greasy, spicy, heavy foods. If nausea and/or vomiting occur, drink only clear liquids until the nausea and/or vomiting subsides. Call your physician if vomiting continues.  Special Instructions/Symptoms: Your throat may feel dry or sore from the anesthesia or the breathing tube placed in your throat during surgery. If this causes discomfort, gargle with warm salt water. The discomfort should disappear within 24 hours.  If you had a scopolamine patch placed behind your ear for the management of post- operative nausea and/or vomiting:  1. The medication in the patch is effective for 72 hours, after which it should be removed.  Wrap patch in a tissue and discard in the trash. Wash hands thoroughly with soap and water. 2. You may remove the patch earlier than 72 hours if you experience unpleasant side effects which may include dry mouth, dizziness or visual disturbances. 3. Avoid touching the patch. Wash your hands with soap and water after contact with the patch.

## 2020-09-25 NOTE — Anesthesia Procedure Notes (Signed)
Procedure Name: LMA Insertion Date/Time: 09/25/2020 8:41 AM Performed by: Lauralyn Primes, CRNA Pre-anesthesia Checklist: Patient identified, Emergency Drugs available, Suction available and Patient being monitored Patient Re-evaluated:Patient Re-evaluated prior to induction Oxygen Delivery Method: Circle system utilized Preoxygenation: Pre-oxygenation with 100% oxygen Induction Type: IV induction Ventilation: Mask ventilation without difficulty LMA: LMA inserted LMA Size: 4.0 Number of attempts: 1 Airway Equipment and Method: Bite block Placement Confirmation: positive ETCO2 Tube secured with: Tape Dental Injury: Teeth and Oropharynx as per pre-operative assessment

## 2020-09-25 NOTE — Transfer of Care (Signed)
Immediate Anesthesia Transfer of Care Note  Patient: Amber Orozco  Procedure(s) Performed: LEFT BREAST LUMPECTOMY WITH RADIOACTIVE SEED LOCALIZATION (Left Breast)  Patient Location: PACU  Anesthesia Type:General  Level of Consciousness: awake, alert  and oriented  Airway & Oxygen Therapy: Patient Spontanous Breathing and Patient connected to face mask oxygen  Post-op Assessment: Report given to RN and Post -op Vital signs reviewed and stable  Post vital signs: Reviewed and stable  Last Vitals:  Vitals Value Taken Time  BP 117/57 09/25/20 0920  Temp    Pulse 74 09/25/20 0921  Resp 19 09/25/20 0921  SpO2 100 % 09/25/20 0921  Vitals shown include unvalidated device data.  Last Pain:  Vitals:   09/25/20 0813  TempSrc: Oral  PainSc: 0-No pain      Patients Stated Pain Goal: 3 (09/25/20 0813)  Complications: No complications documented.

## 2020-09-25 NOTE — Anesthesia Preprocedure Evaluation (Signed)
Anesthesia Evaluation  Patient identified by MRN, date of birth, ID band Patient awake    Reviewed: Allergy & Precautions, NPO status , Patient's Chart, lab work & pertinent test results  Airway Mallampati: I       Dental no notable dental hx.    Pulmonary neg pulmonary ROS,    Pulmonary exam normal        Cardiovascular negative cardio ROS Normal cardiovascular exam     Neuro/Psych negative neurological ROS  negative psych ROS   GI/Hepatic negative GI ROS, Neg liver ROS,   Endo/Other    Renal/GU negative Renal ROS  negative genitourinary   Musculoskeletal negative musculoskeletal ROS (+)   Abdominal Normal abdominal exam  (+)   Peds  Hematology negative hematology ROS (+)   Anesthesia Other Findings   Reproductive/Obstetrics                             Anesthesia Physical Anesthesia Plan  ASA: I  Anesthesia Plan: General   Post-op Pain Management:    Induction:   PONV Risk Score and Plan: 4 or greater and Ondansetron, Dexamethasone and Midazolam  Airway Management Planned: LMA  Additional Equipment: None  Intra-op Plan:   Post-operative Plan: Extubation in OR  Informed Consent: I have reviewed the patients History and Physical, chart, labs and discussed the procedure including the risks, benefits and alternatives for the proposed anesthesia with the patient or authorized representative who has indicated his/her understanding and acceptance.     Dental advisory given  Plan Discussed with: CRNA  Anesthesia Plan Comments:         Anesthesia Quick Evaluation

## 2020-09-25 NOTE — Op Note (Addendum)
Operative Note  Date: 09/25/20  Preop diagnosis: Complex sclerosing lesion of the left breast Postop diagnosis: Same Procedure performed: Left breast radioactive seed localized lumpectomy  Surgeon: Donnie Mesa, MD  Assistant: Sheria Lang, MD  Anesthesia: Gen. via LMA  EBL: 5 ml  Indications: This is a 59 year old female who underwent recent screening mammogram that showed a suspicious finding in the left breast.  Biopsy showed a complex sclerosing lesion.  She presents now for excision.   A radioactive seed was placed by radiology.  Description of procedure:  Presence of the seed had been confirmed in the preop holding area.  The patient was brought to the operating room and placed in a supine position on the operating room table. After an adequate level of general anesthesia was obtained, her left breast was prepped with ChloraPrep and draped in sterile fashion. A timeout was taken to ensure the proper patient and proper procedure. We infiltrated the planned incision in the left lateral breast with 0.25% Marcaine with epinephrine. We made a curvilinear incision approximately 4 cm lateral to the nipple.  We dissected down into the breast tissue with cautery. Using the neoprobe for localization, we raised 4 margins around the mass. We dissected down until we were deep to the area of greatest activity. The specimen was removed and oriented with a paint kit. Specimen mammogram confirmed that the biopsy clip and the radioactive seed are within the center of the specimen. This was sent for pathologic examination. We ensured hemostasis. The wound was closed with a deep layer of 3-0 Vicryl and a subcuticular layer of 4-0 Monocryl. Steri-Strips and clean dressings were applied. The patient was then extubated and brought to the recovery room in stable condition. All sponge, instrument, and needle counts are correct.  Disposition: PACU, hemodynamically stable

## 2020-09-25 NOTE — H&P (Signed)
History of Present Illness  The patient is a 59 year old female who presents with a breast mass. Referred by Glendon Axe, MD for left breast CSL  This is this is a healthy 59 year old female who presents with a recent screening mammogram that showed asymmetry in the left breast. Further workup showed a 6 x 4 mm complex sclerosing lesion confirmed by biopsy. No family history of breast cancer in first-degree relatives. Her mother and sister both had lung cancer. A second cousin passed away of breast cancer.  The patient had a previous benign breast biopsy in the lower medial left breast. This was performed [place] 2010.  CLINICAL DATA: Screening.  EXAM: DIGITAL SCREENING BILATERAL MAMMOGRAM WITH TOMO AND CAD  COMPARISON: Previous exam(s).  ACR Breast Density Category c: The breast tissue is heterogeneously dense, which may obscure small masses.  FINDINGS: In the left breast, possible distortion warrants further evaluation. In the right breast, no findings suspicious for malignancy. Images were processed with CAD.  IMPRESSION: Further evaluation is suggested for possible distortion in the left breast.  RECOMMENDATION: Diagnostic mammogram and possibly ultrasound of the left breast. (Code:[id number]-69M)  The patient will be contacted regarding the findings, and additional imaging will be scheduled.  BI-RADS CATEGORY 0: Incomplete. Need additional imaging evaluation and/or prior mammograms for comparison.   Electronically Signed By: Curlene Dolphin M.D. On: 07/02/2020 13:02  CLINICAL DATA: Recall from screening mammography with tomosynthesis, possible architectural distortion involving the OUTER LEFT breast at MIDDLE depth. Personal history of benign excisional biopsy from the LEFT breast in 2010, though the surgical scar is in the INNER breast.  Family history of breast cancer in her sister.  EXAM: DIGITAL DIAGNOSTIC LEFT MAMMOGRAM WITH  TOMO  ULTRASOUND LEFT BREAST  COMPARISON: Previous exam(s).  ACR Breast Density Category c: The breast tissue is heterogeneously dense, which may obscure small masses.  FINDINGS: Tomosynthesis and synthesized spot-compression CC and MLO views of the area of concern in the LEFT breast were obtained.  The small focus of architectural distortion persists on the spot compression images and is more conspicuous on the MLO images. There is no associated mass or suspicious calcifications.  Targeted LEFT breast ultrasound is performed, showing an irregular antiparallel hypoechoic mass at the 3 o'clock position approximately 4 cm from the nipple at MIDDLE to POSTERIOR depth measuring approximately 6 x 4 x 4 mm, demonstrating posterior acoustic shadowing and demonstrating internal power Doppler flow. This is likely the sonographic correlate for the mammographic distortion. Immediately adjacent to this mass is a benign cyst.  Sonographic evaluation of the LEFT axilla demonstrates no pathologic lymphadenopathy.  IMPRESSION: 1. Suspicious 6 mm mass in the OUTER LEFT breast at 3 o'clock position approximately 4 cm from the nipple which is the likely sonographic correlate for the mammographically detected architectural distortion. 2. No pathologic LEFT axillary lymphadenopathy.  RECOMMENDATION: Ultrasound-guided core needle biopsy of the suspicious LEFT breast mass.  If the post clip mammograms after the ultrasound biopsy demonstrate that the suspicious sonographic mass is not the correlate for the mammographic distortion, then stereotactic tomosynthesis biopsy of the distortion would be recommended.  The ultrasound biopsy procedure was discussed with the patient and her questions were answered. She wishes to proceed with the biopsy which has been scheduled at her convenience.  I have discussed the findings and recommendations with the patient.  BI-RADS CATEGORY 5:  Highly suggestive of malignancy.   Electronically Signed By: Evangeline Dakin M.D. On: 07/23/2020 16:44  CLINICAL DATA: 59 year old female presenting for  ultrasound-guided biopsy of a left breast mass.  EXAM: ULTRASOUND GUIDED LEFT BREAST CORE NEEDLE BIOPSY  COMPARISON: Previous exam(s).  PROCEDURE: I met with the patient and we discussed the procedure of ultrasound-guided biopsy, including benefits and alternatives. We discussed the high likelihood of a successful procedure. We discussed the risks of the procedure, including infection, bleeding, tissue injury, clip migration, and inadequate sampling. Informed written consent was given. The usual time-out protocol was performed immediately prior to the procedure.  Lesion quadrant: Lower outer quadrant  Using sterile technique and 1% Lidocaine as local anesthetic, under direct ultrasound visualization, a 14 gauge spring-loaded device was used to perform biopsy of a mass in the left breast at 3 o'clock using a lateral approach. At the conclusion of the procedure a ribbon shaped tissue marker clip was deployed into the biopsy cavity. Follow up 2 view mammogram was performed and dictated separately.  IMPRESSION: Ultrasound guided biopsy of a left breast mass at 3 o'clock. No apparent complications.  Electronically Signed: By: Ammie Ferrier M.D. 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 This was found to be concordant by Dr. Ammie Ferrier, with excision recommended.  Pathology results were discussed with the patient by telephone with Kathrine Haddock Bilingual Patient Services Representative. The patient reported doing well after the biopsy with tenderness at the site. Post biopsy instructions and care were reviewed and questions were answered. The patient was encouraged to call [facility] for any additional concerns.  Vance Gather CMA with Dr. Renaldo Reel of [place] was notified of results and patient request. Surgical consultation has been arranged with Dr. Donnie Mesa at [facility] on August 19, 2020, per patient request.  Pathology results reported by Stacie Acres RN on 07/30/2020.   Electronically Signed By: Ammie Ferrier M.D. On: 07/30/2020 14:51    Problem List/Past Medical  MASS OF LEFT BREAST ON MAMMOGRAM (N63.20)  Past Surgical History  Breast Mass; Local Excision Left. Cesarean Section - 1  Diagnostic Studies History Colonoscopy 1-5 years ago Mammogram within last year Pap Smear 1-5 years ago  Allergies  Cipro *FLUOROQUINOLONES* Allergies Reconciled  Medication History  No Current Medications Medications Reconciled  Pregnancy / Birth History  Age of menopause 74-50 Gravida 3 Irregular periods Maternal age 76-40  Other Problems  No pertinent past medical history     Review of Systems  General Not Present- Appetite Loss, Chills, Fatigue, Fever, Night Sweats, Weight Gain and Weight Loss. Skin Not Present- Change in Wart/Mole, Dryness, Hives, Jaundice, New Lesions, Non-Healing Wounds, Rash and Ulcer. HEENT Not Present- Earache, Hearing Loss, Hoarseness, Nose Bleed, Oral Ulcers, Ringing in the Ears, Seasonal Allergies, Sinus Pain, Sore Throat, Visual Disturbances, Wears glasses/contact lenses and Yellow Eyes. Respiratory Not Present- Bloody sputum, Chronic Cough, Difficulty Breathing, Snoring and Wheezing. Breast Not Present- Breast Mass, Breast Pain, Nipple Discharge and Skin Changes. Cardiovascular Not Present- Chest Pain, Difficulty Breathing Lying Down, Leg Cramps, Palpitations, Rapid Heart Rate, Shortness of Breath and Swelling of Extremities. Gastrointestinal Not Present- Abdominal Pain, Bloating,  Bloody Stool, Change in Bowel Habits, Chronic diarrhea, Constipation, Difficulty Swallowing, Excessive gas, Gets full quickly at meals, Hemorrhoids, Indigestion, Nausea, Rectal Pain and Vomiting. Female Genitourinary Not Present- Frequency, Nocturia, Painful Urination, Pelvic Pain and Urgency. Musculoskeletal Not Present- Back Pain, Joint Pain, Joint Stiffness, Muscle Pain, Muscle Weakness and Swelling of Extremities. Neurological Not Present- Decreased Memory, Fainting,  Headaches, Numbness, Seizures, Tingling, Tremor, Trouble walking and Weakness. Psychiatric Not Present- Anxiety, Bipolar, Change in Sleep Pattern, Depression, Fearful and Frequent crying. Endocrine Not Present- Cold Intolerance, Excessive Hunger, Hair Changes, Heat Intolerance, Hot flashes and New Diabetes. Hematology Not Present- Blood Thinners, Easy Bruising, Excessive bleeding, Gland problems, HIV and Persistent Infections.  Vitals  Weight: 129 lb Height: 62in Body Surface Area: 1.59 m Body Mass Index: 23.59 kg/m  Temp.: 97.24F  Pulse: 72 (Regular)  BP: 120/72(Sitting, Left Arm, Standard)        Physical Exam   The physical exam findings are as follows: Note:Constitutional: WDWN in NAD, conversant, no obvious deformities; resting comfortably Eyes: Pupils equal, round; sclera anicteric; moist conjunctiva; no lid lag HENT: Oral mucosa moist; good dentition Neck: No masses palpated, trachea midline; no thyromegaly Lungs: CTA bilaterally; normal respiratory effort Breasts: Symmetrical, no nipple changes or discharge, no right breast masses or axillary lymphadenopathy Left breast shows a healed lower inner quadrant transverse incision. No palpable masses. No axillary lymphadenopathy. CV: Regular rate and rhythm; no murmurs; extremities well-perfused with no edema Abd: +bowel sounds, soft, non-tender, no palpable organomegaly; no palpable hernias Musc: Normal gait; no apparent clubbing or cyanosis  in extremities Lymphatic: No palpable cervical or axillary lymphadenopathy Skin: Warm, dry; no sign of jaundice Psychiatric - alert and oriented x 4; calm mood and affect    Assessment & Plan   MASS OF LEFT BREAST ON MAMMOGRAM (N63.20) Impression: Complex sclerosing lesion 6 x 4 x 4 mm Left 0300 4cmfn  Current Plans Schedule for Surgery - Left radioactive seed localized lumpectomy. The surgical procedure has been discussed with the patient. Potential risks, benefits, alternative treatments, and expected outcomes have been explained. All of the patient's questions at this time have been answered. The likelihood of reaching the patient's treatment goal is good. The patient understand the proposed surgical procedure and wishes to proceed.  Imogene Burn. Georgette Dover, MD, [facility]  General/ Trauma Surgery   09/25/2020 7:29 AM

## 2020-09-25 NOTE — Anesthesia Postprocedure Evaluation (Signed)
Anesthesia Post Note  Patient: Amber Orozco  Procedure(s) Performed: LEFT BREAST LUMPECTOMY WITH RADIOACTIVE SEED LOCALIZATION (Left Breast)     Patient location during evaluation: Phase II Anesthesia Type: General Level of consciousness: awake and sedated Pain management: pain level controlled Vital Signs Assessment: post-procedure vital signs reviewed and stable Respiratory status: spontaneous breathing Cardiovascular status: stable Postop Assessment: no apparent nausea or vomiting Anesthetic complications: no   No complications documented.  Last Vitals:  Vitals:   09/25/20 0930 09/25/20 0945  BP: (!) 108/46 (!) 105/50  Pulse: 73 66  Resp: 18 20  Temp:    SpO2: 100% 100%    Last Pain:  Vitals:   09/25/20 0945  TempSrc:   PainSc: 0-No pain                 Caren Macadam

## 2020-09-25 NOTE — Interval H&P Note (Signed)
History and Physical Interval Note:  09/25/2020 8:13 AM  Amber Orozco  has presented today for surgery, with the diagnosis of LEFT BREAST COMPLEX SCLEROSING.  The various methods of treatment have been discussed with the patient and family. After consideration of risks, benefits and other options for treatment, the patient has consented to  Procedure(s) with comments: LEFT BREAST LUMPECTOMY WITH RADIOACTIVE SEED LOCALIZATION (Left) - LMA as a surgical intervention.  The patient's history has been reviewed, patient examined, no change in status, stable for surgery.  I have reviewed the patient's chart and labs.  Questions were answered to the patient's satisfaction.     Wynona Luna

## 2020-09-26 ENCOUNTER — Encounter (HOSPITAL_BASED_OUTPATIENT_CLINIC_OR_DEPARTMENT_OTHER): Payer: Self-pay | Admitting: Surgery

## 2020-09-26 LAB — SURGICAL PATHOLOGY

## 2021-12-24 ENCOUNTER — Encounter (HOSPITAL_COMMUNITY): Payer: Self-pay

## 2022-01-09 ENCOUNTER — Other Ambulatory Visit: Payer: Self-pay

## 2022-01-09 ENCOUNTER — Ambulatory Visit
Admission: RE | Admit: 2022-01-09 | Discharge: 2022-01-09 | Disposition: A | Discharge status: Home / Self Care | Payer: BC Managed Care – PPO | Source: Ambulatory Visit | Attending: Physician Assistant | Admitting: Physician Assistant

## 2022-01-09 ENCOUNTER — Other Ambulatory Visit: Payer: Self-pay | Admitting: Physician Assistant

## 2022-01-09 DIAGNOSIS — Z1231 Encounter for screening mammogram for malignant neoplasm of breast: Secondary | ICD-10-CM

## 2022-04-20 IMAGING — MG MM DIGITAL SCREENING BILAT W/ TOMO AND CAD
8 series · 9 of 24 positions shown · non-contrast
Comparison: Previous exam(s).

CLINICAL DATA: Screening.

EXAM:
DIGITAL SCREENING BILATERAL MAMMOGRAM WITH TOMOSYNTHESIS AND CAD
TECHNIQUE: Bilateral screening digital craniocaudal and mediolateral oblique
mammograms were obtained. Bilateral screening digital breast
tomosynthesis was performed. The images were evaluated with
computer-aided detection.

[L MLO synth-2D]
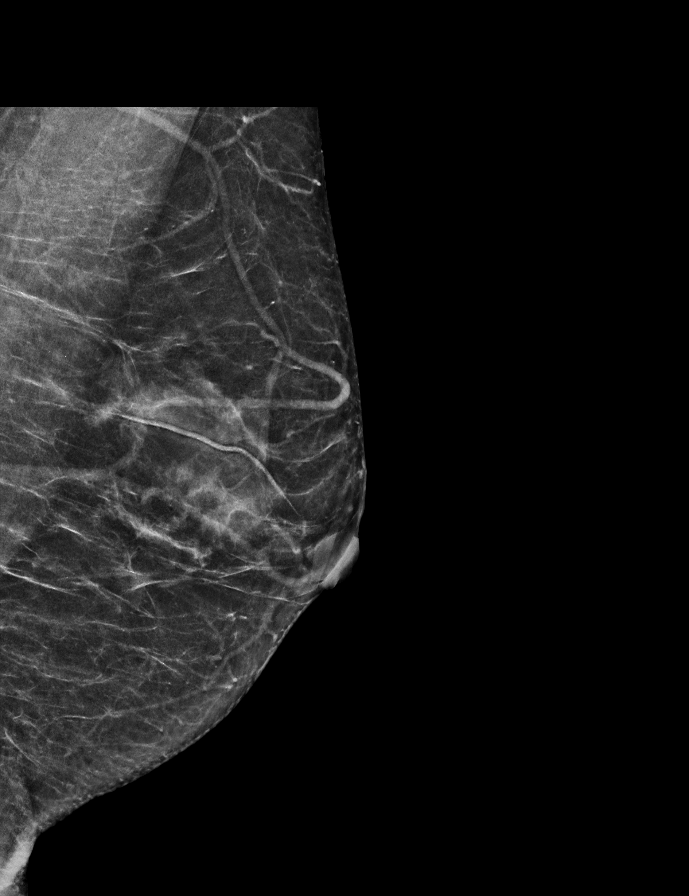

[R MLO synth-2D]
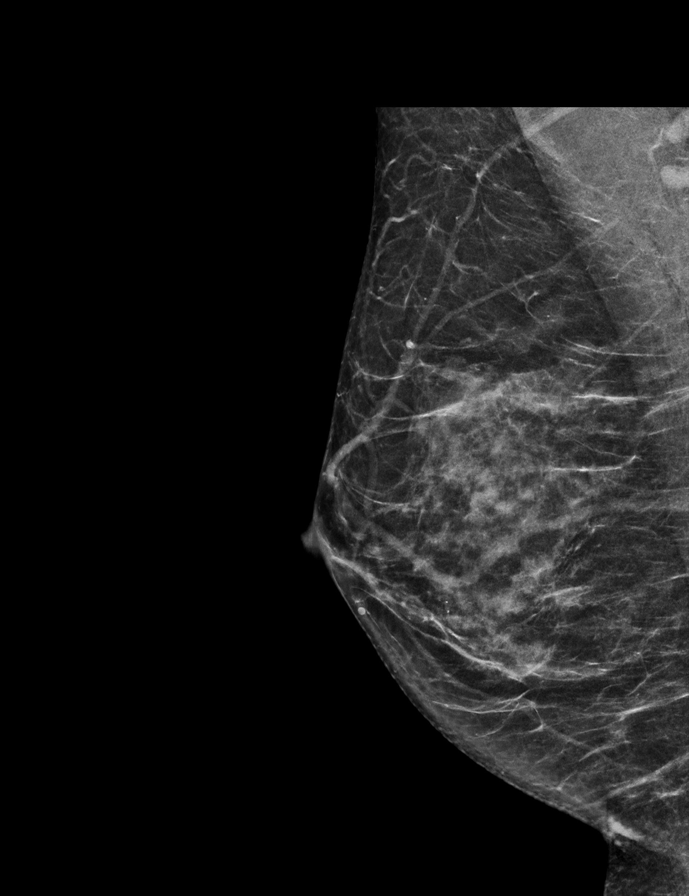

[L CC synth-2D]
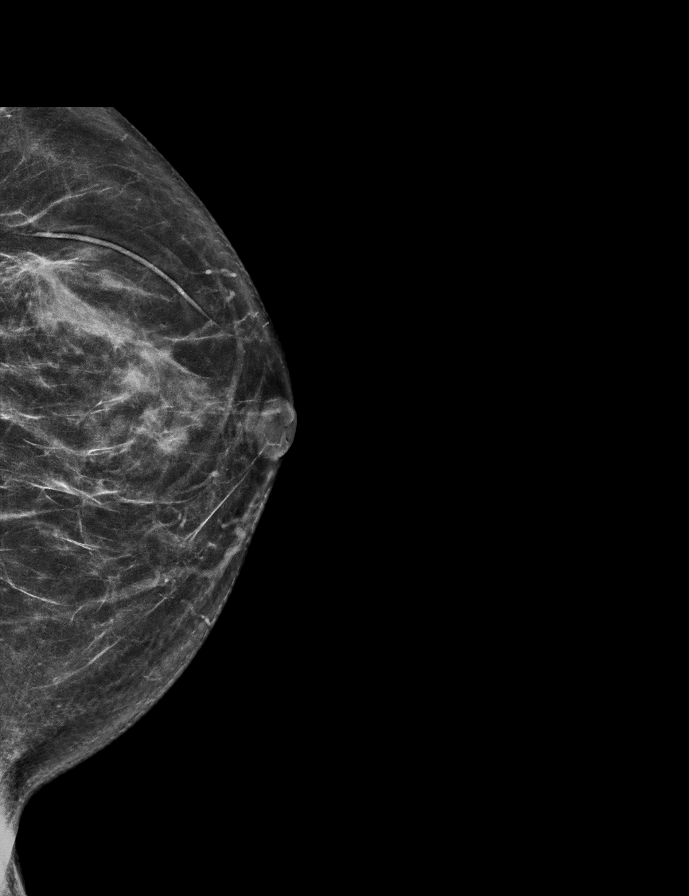

[R CC synth-2D]
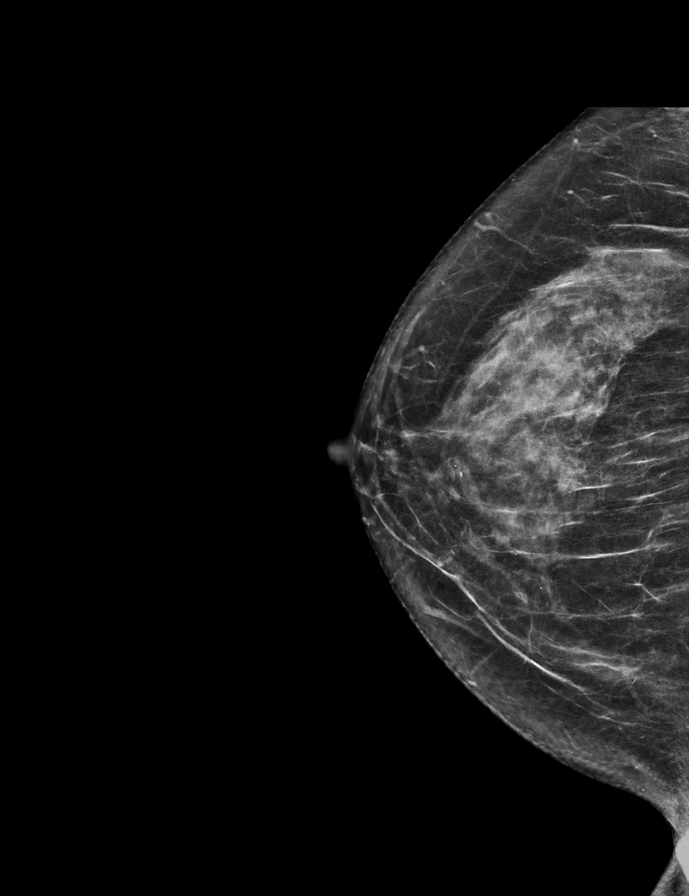

[R CC tomo · 2 of 62 frames shown]
[frame 21/62]
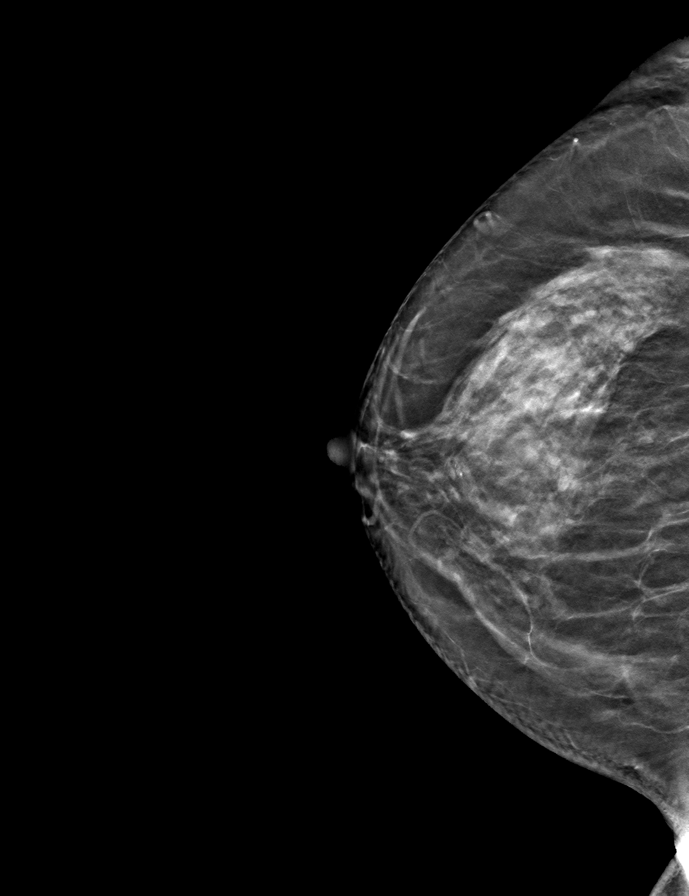
[frame 31/62]
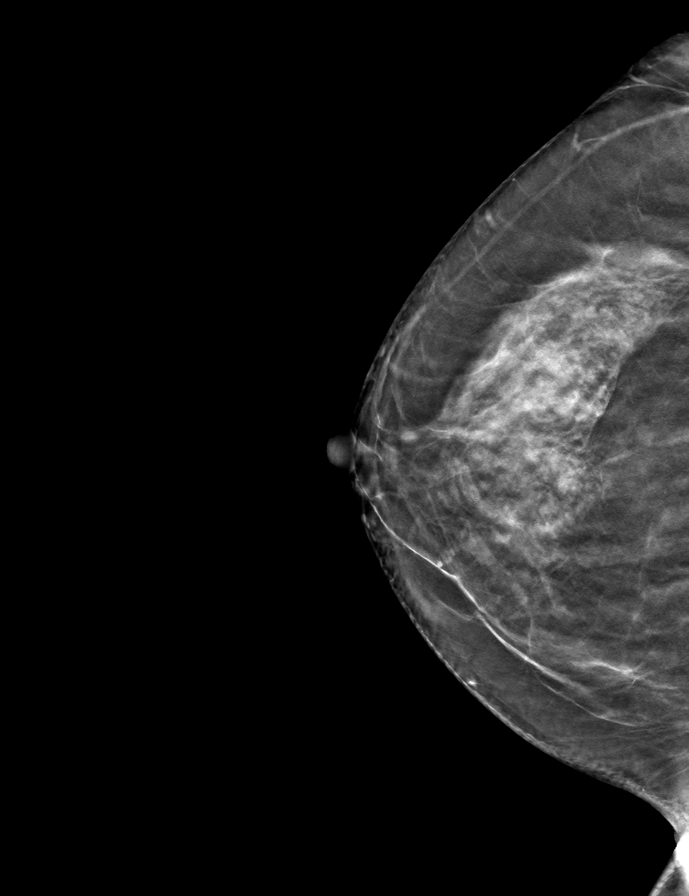

[L CC tomo · tomo slice 31/60.0]
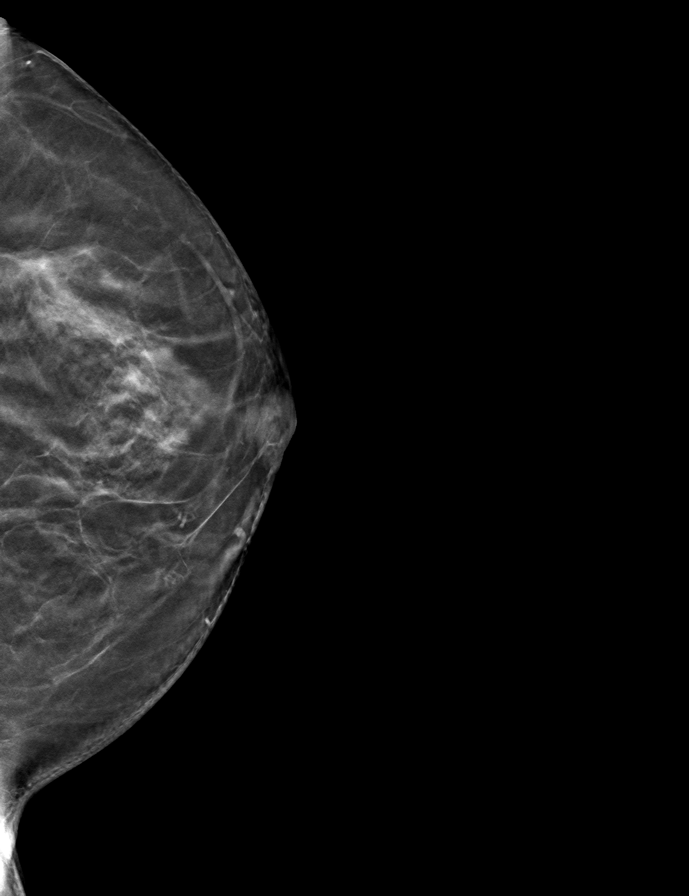

[R MLO tomo · tomo slice 30/59.0]
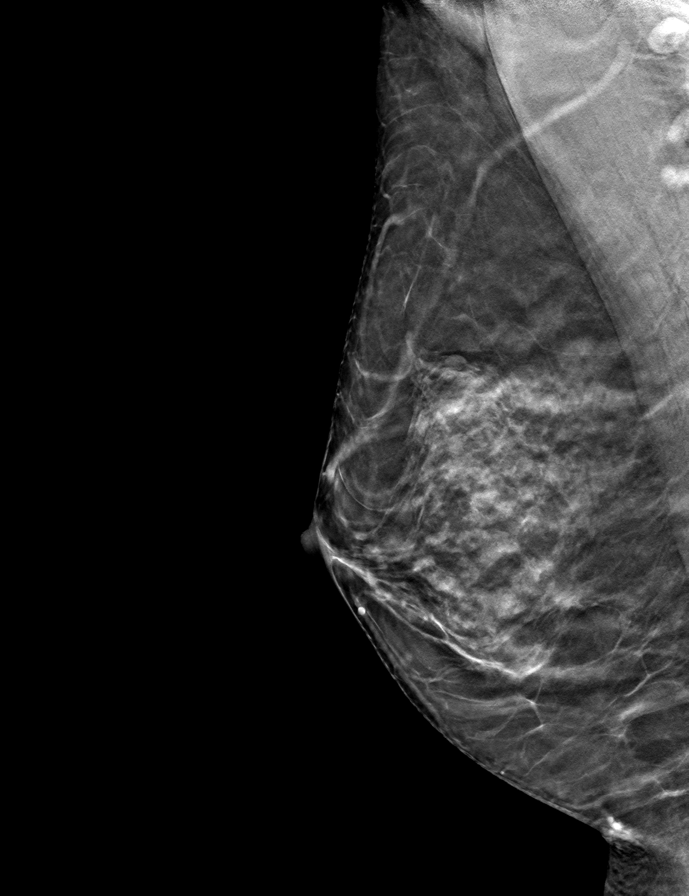

[L MLO tomo · tomo slice 29/58.0]
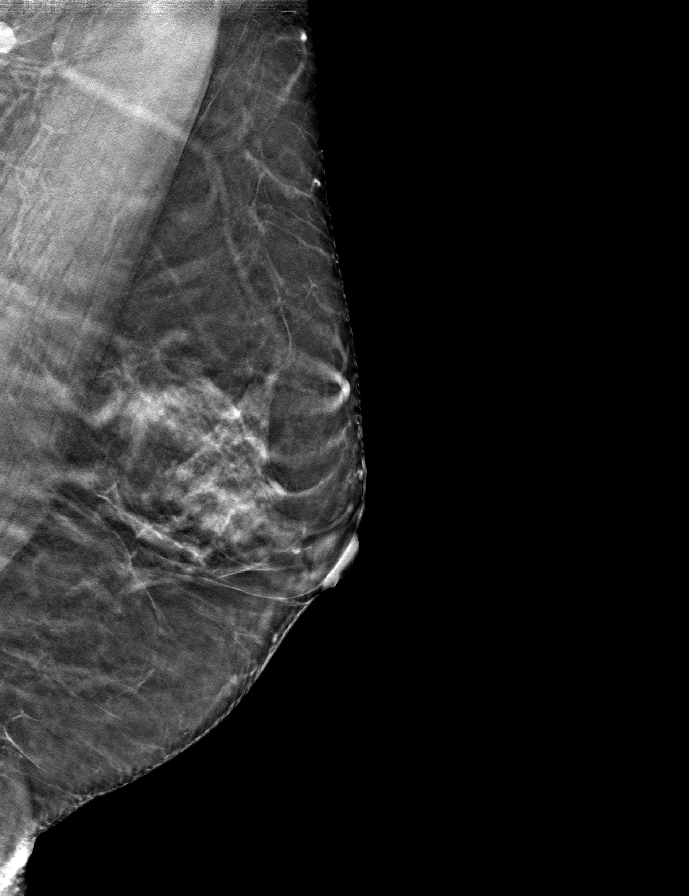

[9 of 24 positions shown; findings below may reference images not displayed]

ACR Breast Density Category c: The breast tissue is heterogeneously
dense, which may obscure small masses.
FINDINGS: There are no findings suspicious for malignancy.
IMPRESSION: No mammographic evidence of malignancy. A result letter of this
screening mammogram will be mailed directly to the patient.

RECOMMENDATION:
Screening mammogram in one year. (Code:[id number])

BI-RADS CATEGORY  1: Negative.

## 2023-01-08 ENCOUNTER — Other Ambulatory Visit: Payer: Self-pay | Admitting: Internal Medicine

## 2023-01-08 DIAGNOSIS — Z1231 Encounter for screening mammogram for malignant neoplasm of breast: Secondary | ICD-10-CM

## 2023-01-15 ENCOUNTER — Ambulatory Visit
Admission: RE | Admit: 2023-01-15 | Discharge: 2023-01-15 | Disposition: A | Discharge status: Home / Self Care | Payer: Managed Care, Other (non HMO) | Source: Ambulatory Visit | Attending: Internal Medicine | Admitting: Internal Medicine

## 2023-01-15 DIAGNOSIS — Z1231 Encounter for screening mammogram for malignant neoplasm of breast: Secondary | ICD-10-CM

## 2024-02-08 ENCOUNTER — Ambulatory Visit
Admission: RE | Admit: 2024-02-08 | Discharge: 2024-02-08 | Disposition: A | Discharge status: Home / Self Care | Source: Ambulatory Visit | Attending: Internal Medicine | Admitting: Internal Medicine

## 2024-02-08 ENCOUNTER — Other Ambulatory Visit: Payer: Self-pay | Admitting: Internal Medicine

## 2024-02-08 DIAGNOSIS — Z1231 Encounter for screening mammogram for malignant neoplasm of breast: Secondary | ICD-10-CM
